# Patient Record
Sex: Male | Born: 1960
Health system: Southern US, Community
[De-identification: ages and names within clinical notes are randomized; demographics above are authoritative.]

## PROBLEM LIST (undated history)

## (undated) DIAGNOSIS — I10 Essential (primary) hypertension: Secondary | ICD-10-CM

## (undated) DIAGNOSIS — T7840XA Allergy, unspecified, initial encounter: Secondary | ICD-10-CM

## (undated) DIAGNOSIS — F32A Depression, unspecified: Secondary | ICD-10-CM

## (undated) DIAGNOSIS — F329 Major depressive disorder, single episode, unspecified: Secondary | ICD-10-CM

## (undated) HISTORY — DX: Depression, unspecified: F32.A

## (undated) HISTORY — DX: Major depressive disorder, single episode, unspecified: F32.9

## (undated) HISTORY — DX: Allergy, unspecified, initial encounter: T78.40XA

## (undated) HISTORY — DX: Essential (primary) hypertension: I10

## (undated) SURGERY — Surgical Case
Anesthesia: *Unknown

---

## 1999-04-10 ENCOUNTER — Emergency Department (HOSPITAL_COMMUNITY): Admission: EM | Admit: 1999-04-10 | Discharge: 1999-04-11 | Payer: Self-pay | Admitting: Emergency Medicine

## 2000-05-24 ENCOUNTER — Ambulatory Visit (HOSPITAL_COMMUNITY): Admission: RE | Admit: 2000-05-24 | Discharge: 2000-05-24 | Payer: Self-pay | Admitting: Internal Medicine

## 2000-05-24 ENCOUNTER — Encounter: Payer: Self-pay | Admitting: Internal Medicine

## 2001-08-20 ENCOUNTER — Other Ambulatory Visit: Admission: RE | Admit: 2001-08-20 | Discharge: 2001-08-20 | Payer: Self-pay | Admitting: Urology

## 2001-09-03 ENCOUNTER — Ambulatory Visit (HOSPITAL_COMMUNITY): Admission: RE | Admit: 2001-09-03 | Discharge: 2001-09-03 | Payer: Self-pay | Admitting: Internal Medicine

## 2001-09-03 ENCOUNTER — Encounter: Payer: Self-pay | Admitting: Internal Medicine

## 2002-08-14 ENCOUNTER — Ambulatory Visit (HOSPITAL_COMMUNITY): Admission: RE | Admit: 2002-08-14 | Discharge: 2002-08-14 | Payer: Self-pay | Admitting: Internal Medicine

## 2002-08-14 ENCOUNTER — Encounter: Payer: Self-pay | Admitting: Internal Medicine

## 2005-12-10 ENCOUNTER — Emergency Department (HOSPITAL_COMMUNITY): Admission: EM | Admit: 2005-12-10 | Discharge: 2005-12-10 | Payer: Self-pay | Admitting: Emergency Medicine

## 2015-05-18 ENCOUNTER — Ambulatory Visit (INDEPENDENT_AMBULATORY_CARE_PROVIDER_SITE_OTHER): Payer: Worker's Compensation | Admitting: Urgent Care

## 2015-05-18 ENCOUNTER — Ambulatory Visit: Payer: Worker's Compensation

## 2015-05-18 VITALS — BP 110/70 | HR 92 | Temp 98.1°F | Resp 16 | Ht 68.5 in | Wt 248.0 lb

## 2015-05-18 DIAGNOSIS — M25571 Pain in right ankle and joints of right foot: Secondary | ICD-10-CM | POA: Diagnosis not present

## 2015-05-18 DIAGNOSIS — S9031XA Contusion of right foot, initial encounter: Secondary | ICD-10-CM | POA: Diagnosis not present

## 2015-05-18 DIAGNOSIS — M79671 Pain in right foot: Secondary | ICD-10-CM

## 2015-05-18 DIAGNOSIS — W19XXXA Unspecified fall, initial encounter: Secondary | ICD-10-CM

## 2015-05-18 DIAGNOSIS — S9030XA Contusion of unspecified foot, initial encounter: Secondary | ICD-10-CM | POA: Insufficient documentation

## 2015-05-18 DIAGNOSIS — S9001XA Contusion of right ankle, initial encounter: Secondary | ICD-10-CM

## 2015-05-18 DIAGNOSIS — S8253XA Displaced fracture of medial malleolus of unspecified tibia, initial encounter for closed fracture: Secondary | ICD-10-CM | POA: Insufficient documentation

## 2015-05-18 MED ORDER — MELOXICAM 15 MG PO TABS: 15.0000 mg | ORAL_TABLET | Freq: Every day | ORAL | Status: DC

## 2015-05-18 MED ORDER — OXYCODONE-ACETAMINOPHEN 5-325 MG PO TABS: 1.0000 | ORAL_TABLET | Freq: Three times a day (TID) | ORAL | Status: AC | PRN

## 2015-05-18 NOTE — Progress Notes (Addendum)
MRN: [id number] DOB: [birth year]  Subjective:   Malik Kennedy is a 54 y.o. male presenting for chief complaint of Ankle Injury and Foot Injury  Reports fall from his truck earlier today while at work. Patient landed from about 5 feet high on both feet and states that he felt pain immediately that has progressively worsened over his right heel and ankle. Reports that he has had increasing difficulty walking and bearing weight on his right foot. Also feels like his foot is numb, this is new for him. Admits that he has decreased range of motion but is mostly due to the sharp pain. Denies swelling, redness, bony deformity, tingling; denies rolling his ankle. Denies smoking cigarettes or alcohol use. Denies any other aggravating or relieving factors, no other questions or concerns.  Medications and past medical history not entered due to worker's comp case.  ROS As in subjective.  Objective:   Vitals: BP 110/70 mmHg  Pulse 92  Temp(Src) 98.1 F (36.7 C) (Oral)  Resp 16  Ht 5' 8.5" (1.74 m)  Wt 248 lb (112.492 kg)  BMI 37.16 kg/m2  SpO2 98%  Physical Exam  Constitutional: He is oriented to person, place, and time. He appears well-developed and well-nourished.  Cardiovascular: Normal rate.   Pulmonary/Chest: Effort normal.  Musculoskeletal:       Right ankle: He exhibits decreased range of motion (patient reports that he does not want to move it due to pain). He exhibits no swelling, no ecchymosis, no deformity, no laceration and normal pulse. Tenderness. Lateral malleolus, AITFL, posterior TFL and head of 5th metatarsal tenderness found. No medial malleolus and no proximal fibula tenderness found. Achilles tendon exhibits pain. Achilles tendon exhibits no defect and normal Thompson's test results.  Neurological: He is alert and oriented to person, place, and time.  Skin: Skin is warm and dry. No rash noted. No erythema. No pallor.   [facility] reading (PRIMARY) by  Dr. Patsy Lager and  PA-Zaria Taha. Right ankle and foot - possible avulsion fracture of medial malleolus, old fractures of 5th distal and proximal phalanges.  Dg Ankle Complete Right  05/18/2015   CLINICAL DATA:  54 year old male with right ankle and foot pain concern for possible avulsion fracture of the medial malleolus  EXAM: RIGHT ANKLE - COMPLETE 3+ VIEW  COMPARISON:  Foot radiograph dated 05/18/2015  FINDINGS: There is no fracture or dislocation. The ankle mortise is intact. Soft tissues are unremarkable.  IMPRESSION: No fracture or dislocation.   Electronically Signed   By: Elgie Collard M.D.   On: 05/18/2015 17:07   Dg Foot Complete Right  05/18/2015   CLINICAL DATA:  Acute right foot pain after falling out of truck. Initial encounter.  EXAM: RIGHT FOOT COMPLETE - 3+ VIEW  COMPARISON:  None.  FINDINGS: Moderately displaced fracture seen involving the proximal base of the the fifth proximal phalanx. This appears to be closed and posttraumatic. Joint spaces appear to be intact. No soft tissue abnormality is noted.  IMPRESSION: Moderately displaced fracture seen involving the fifth proximal phalanx.   Electronically Signed   By: Lupita Raider, M.D.   On: 05/18/2015 17:09   Assessment and Plan :   1. Fall, initial encounter 2. Right foot pain 3. Right ankle pain 4. Contusion of heel, right, initial encounter 5. Avulsion fracture of medial malleolus, right, closed, initial encounter - Stable, wear cam-walker boot and use crutches for walking, work restrictions provided, follow up in 3 days.  Wallis Bamberg, PA-C [facility] [phone] 05/18/2015 3:40 PM    UPDATE: Radiologist over-read was negative for fracture. Continue with treatment plan as discussed in clinic.

## 2015-05-21 ENCOUNTER — Ambulatory Visit (INDEPENDENT_AMBULATORY_CARE_PROVIDER_SITE_OTHER): Payer: Worker's Compensation | Admitting: Internal Medicine

## 2015-05-21 VITALS — BP 122/74 | HR 70 | Temp 98.0°F | Resp 17 | Ht 68.5 in | Wt 254.0 lb

## 2015-05-21 DIAGNOSIS — S9031XD Contusion of right foot, subsequent encounter: Secondary | ICD-10-CM | POA: Diagnosis not present

## 2015-05-21 DIAGNOSIS — M79671 Pain in right foot: Secondary | ICD-10-CM | POA: Diagnosis not present

## 2015-05-21 NOTE — Progress Notes (Signed)
Received over-read reports:  RIGHT ANKLE - COMPLETE 3+ VIEW  COMPARISON: Foot radiograph dated 05/18/2015  FINDINGS: There is no fracture or dislocation. The ankle mortise is intact. Soft tissues are unremarkable.  IMPRESSION: No fracture or dislocation.  RIGHT FOOT COMPLETE - 3+ VIEW  COMPARISON: None.  FINDINGS: Moderately displaced fracture seen involving the proximal base of the the fifth proximal phalanx. This appears to be closed and posttraumatic. Joint spaces appear to be intact. No soft tissue abnormality is noted.  IMPRESSION: Moderately displaced fracture seen involving the fifth proximal Phalanx.  Pt endorses history of old toe fracture. Otherwise no acute fracture noted, he does not have pain in the toes so suspect this is an old fracture.  Will send him a copy

## 2015-05-21 NOTE — Progress Notes (Signed)
   Subjective:    Patient ID: Malik Kennedy, male    DOB: Feb 08, 1961, 54 y.o.   MRN: [id number] This chart was scribed for Ellamae Siaobert Onaje Warne, MD by Littie Deedsichard Sun, Medical Scribe. This patient was seen in Room 4 and the patient's care was started at 9:11 AM.   HPI HPI Comments: Malik Kennedy is a 54 y.o. male who presents to the [facility] for a follow-up for a right foot and ankle injury at work that occurred 3 days ago after falling from a truck. Patient was seen here by Wallis BambergMario Mani, PA-C the day of the injury. He notes that he still has pain in his ankle and that it is still difficult to bear weight, even with the boot on. His work does not have light duty.   Review of Systems No change from 3 days ago    Objective:   Physical Exam  Constitutional: He is oriented to person, place, and time. He appears well-developed and well-nourished. No distress.  HENT:  Head: Normocephalic and atraumatic.  Eyes: Pupils are equal, round, and reactive to light.  Neck: Neck supple.  Musculoskeletal:  Right ankle is minimally swollen but has tenderness over the anterior talofibular ligament area with resolving ecchymoses. Ankle mortise is stable to exam. Achilles stable. No pain over the fifth metatarsal. Good flexion and extension of the toes. Very tender heel squeeze and much pain with pressure up under the heel. Stretching of the plantar Fascia increases pain at the heel  Neurological: He is alert and oriented to person, place, and time.  Psychiatric: He has a normal mood and affect. His behavior is normal.  Vitals reviewed.   Radiologist suggested no fracture with original x-ray      Assessment & Plan:  Right foot pain  Contusion of heel, right, subsequent encounter  He will continue advancing weight bearing as tolerated with crutches and Cam Walker He is cleared only to work sitting which is not available at his job site Follow-up exam 2 weeks Continue ice twice a day  I  have completed the patient encounter in its entirety as documented by the scribe, with editing by me where necessary. Keatyn Jawad P. Merla Richesoolittle, M.D.

## 2015-06-04 ENCOUNTER — Ambulatory Visit (INDEPENDENT_AMBULATORY_CARE_PROVIDER_SITE_OTHER): Payer: Worker's Compensation | Admitting: Emergency Medicine

## 2015-06-04 VITALS — BP 120/76 | HR 77 | Temp 98.5°F | Resp 16 | Ht 69.0 in | Wt 248.6 lb

## 2015-06-04 DIAGNOSIS — M25571 Pain in right ankle and joints of right foot: Secondary | ICD-10-CM | POA: Diagnosis not present

## 2015-06-04 MED ORDER — NAPROXEN SODIUM 550 MG PO TABS: 550.0000 mg | ORAL_TABLET | Freq: Two times a day (BID) | ORAL | Status: AC

## 2015-06-04 NOTE — Patient Instructions (Signed)

## 2015-06-04 NOTE — Progress Notes (Signed)
Subjective:  Patient ID: Malik Kennedy, male    DOB: 09/08/1961  Age: 54 y.o. MRN: [id number]  CC: Follow-up   HPI Malik Kennedy presents  for follow-up Workmen's Comp. injury from 3 weeks ago. He jumped out of truck and developed pain in foot and ankle. He was radiographed in the and his x-rays were negative. He was using a boot was encouraged to weeks ago to ambulate on the boot decreased use of his crutches. He is coming here stating that he's stop using a boot and crutches. He still has pain with ambulation. He is not return to work.  History Malik Kennedy has a past medical history of Depression; Hypertension; and Allergy.   He has no past surgical history on file.   His  family history includes Diabetes in his father; Hyperlipidemia in his father; Hypertension in his father and mother; Stroke in his father.  He   reports that he has never smoked. He has never used smokeless tobacco. He reports that he does not drink alcohol or use illicit drugs.  Outpatient Prescriptions Prior to Visit  Medication Sig Dispense Refill  . BUMETANIDE PO Take by mouth.    Marland Kitchen CARVEDILOL PO Take by mouth.    . Dapagliflozin Propanediol (FARXIGA PO) Take by mouth.    . ENALAPRIL MALEATE PO Take by mouth.    . GLYBURIDE PO Take by mouth 2 (two) times daily.    Marland Kitchen METFORMIN HCL PO Take by mouth.    . oxyCODONE-acetaminophen (ROXICET) 5-325 MG per tablet Take 1 tablet by mouth every 8 (eight) hours as needed for severe pain. 20 tablet 0  . SIMVASTATIN PO Take by mouth.    . meloxicam (MOBIC) 15 MG tablet Take 1 tablet (15 mg total) by mouth daily. 30 tablet 0   No facility-administered medications prior to visit.    History   Social History  . Marital Status: Married    Spouse Name: N/A  . Number of Children: N/A  . Years of Education: N/A   Social History Main Topics  . Smoking status: Never Smoker   . Smokeless tobacco: Never Used  . Alcohol Use: No  . Drug Use: No  . Sexual Activity: Not on  file   Other Topics Concern  . None   Social History Narrative     Review of Systems  Constitutional: Negative for fever, chills and appetite change.  HENT: Negative for congestion, ear pain, postnasal drip, sinus pressure and sore throat.   Eyes: Negative for pain and redness.  Respiratory: Negative for cough, shortness of breath and wheezing.   Cardiovascular: Negative for leg swelling.  Gastrointestinal: Negative for nausea, vomiting, abdominal pain, diarrhea, constipation and blood in stool.  Endocrine: Negative for polyuria.  Genitourinary: Negative for dysuria, urgency, frequency and flank pain.  Musculoskeletal: Negative for gait problem.  Skin: Negative for rash.  Neurological: Negative for weakness and headaches.  Psychiatric/Behavioral: Negative for confusion and decreased concentration. The patient is not nervous/anxious.     Objective:  BP 120/76 mmHg  Pulse 77  Temp(Src) 98.5 F (36.9 C) (Oral)  Resp 16  Ht 5\' 9"  (1.753 m)  Wt 248 lb 9.6 oz (112.764 kg)  BMI 36.69 kg/m2  SpO2 98%  Physical Exam  Constitutional: He is oriented to person, place, and time. He appears well-developed and well-nourished.  HENT:  Head: Normocephalic and atraumatic.  Eyes: Conjunctivae are normal. Pupils are equal, round, and reactive to light.  Pulmonary/Chest: Effort normal.  Musculoskeletal: He exhibits  no edema.  Neurological: He is alert and oriented to person, place, and time. Gait abnormal.  Skin: Skin is dry.  Psychiatric: He has a normal mood and affect. His behavior is normal. Thought content normal.   he has an antalgic gait. His musculoskeletal exam essentially normal.    Assessment & Plan:   Pearse was seen today for follow-up.  Diagnoses and all orders for this visit:  Right ankle pain Orders: -     Ambulatory referral to Orthopedic Surgery  Other orders -     naproxen sodium (ANAPROX DS) 550 MG tablet; Take 1 tablet (550 mg total) by mouth 2 (two) times  daily with a meal.   I have discontinued Malik Kennedy's meloxicam. I am also having him start on naproxen sodium. Additionally, I am having him maintain his GLYBURIDE PO, METFORMIN HCL PO, SIMVASTATIN PO, CARVEDILOL PO, ENALAPRIL MALEATE PO, BUMETANIDE PO, Dapagliflozin Propanediol (FARXIGA PO), and oxyCODONE-acetaminophen.  Meds ordered this encounter  Medications  . naproxen sodium (ANAPROX DS) 550 MG tablet    Sig: Take 1 tablet (550 mg total) by mouth 2 (two) times daily with a meal.    Dispense:  40 tablet    Refill:  0   He was continued on light duty which he cannot do at work Appropriate red flag conditions were discussed with the patient as well as actions that should be taken.  Patient expressed his understanding.  Follow-up: Return in about 1 week (around 06/11/2015).  Carmelina Dane, MD

## 2015-06-07 ENCOUNTER — Telehealth: Payer: Self-pay

## 2015-06-07 NOTE — Telephone Encounter (Signed)
Copy sent to xray. 

## 2015-06-07 NOTE — Telephone Encounter (Signed)
This patient needs an xray disc of RIGHT foot and ankle done on 05/18/15 to take to an orthopedic appt tomorrow Pt wants to pick up today at 5:00

## 2016-08-11 IMAGING — CR DG ANKLE COMPLETE 3+V*R*
3 series · 3 of 3 positions shown · non-contrast
Comparison: Foot radiograph dated 05/18/2015

CLINICAL DATA: 54-year-old male with right ankle and foot pain
concern for possible avulsion fracture of the medial malleolus

EXAM:
RIGHT ANKLE - COMPLETE 3+ VIEW

[AP]
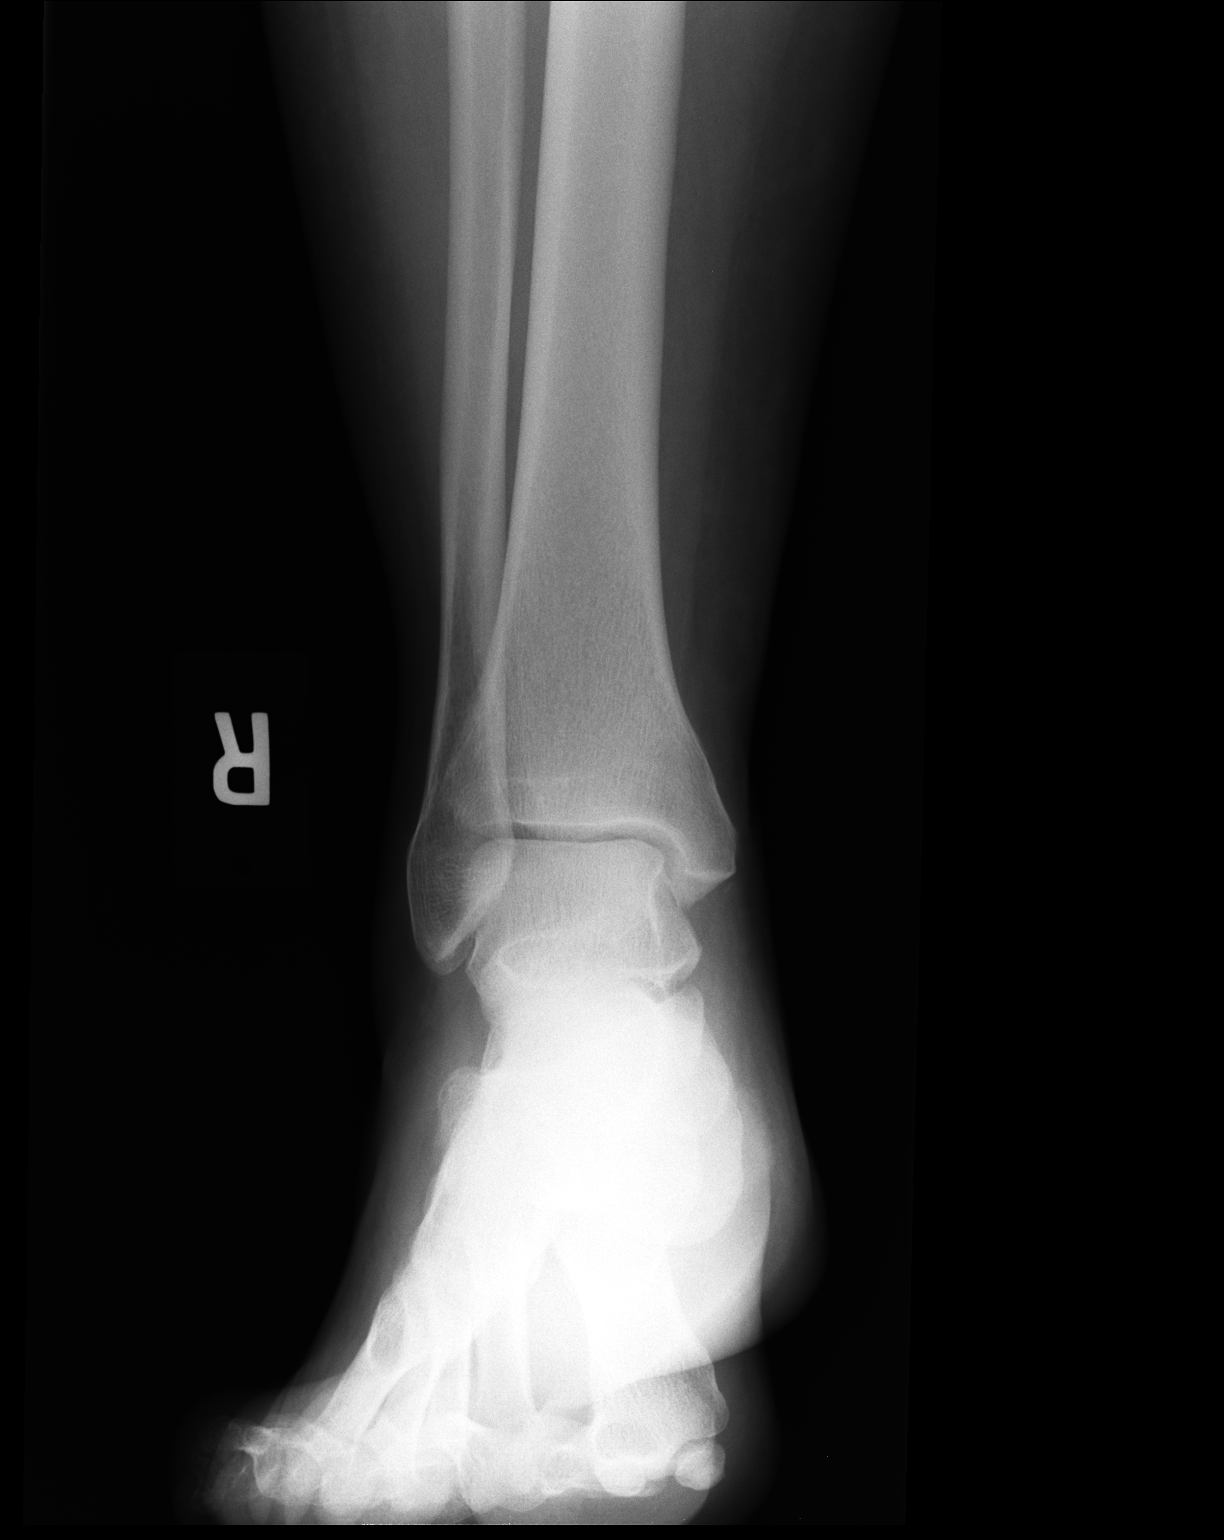

[lateral]
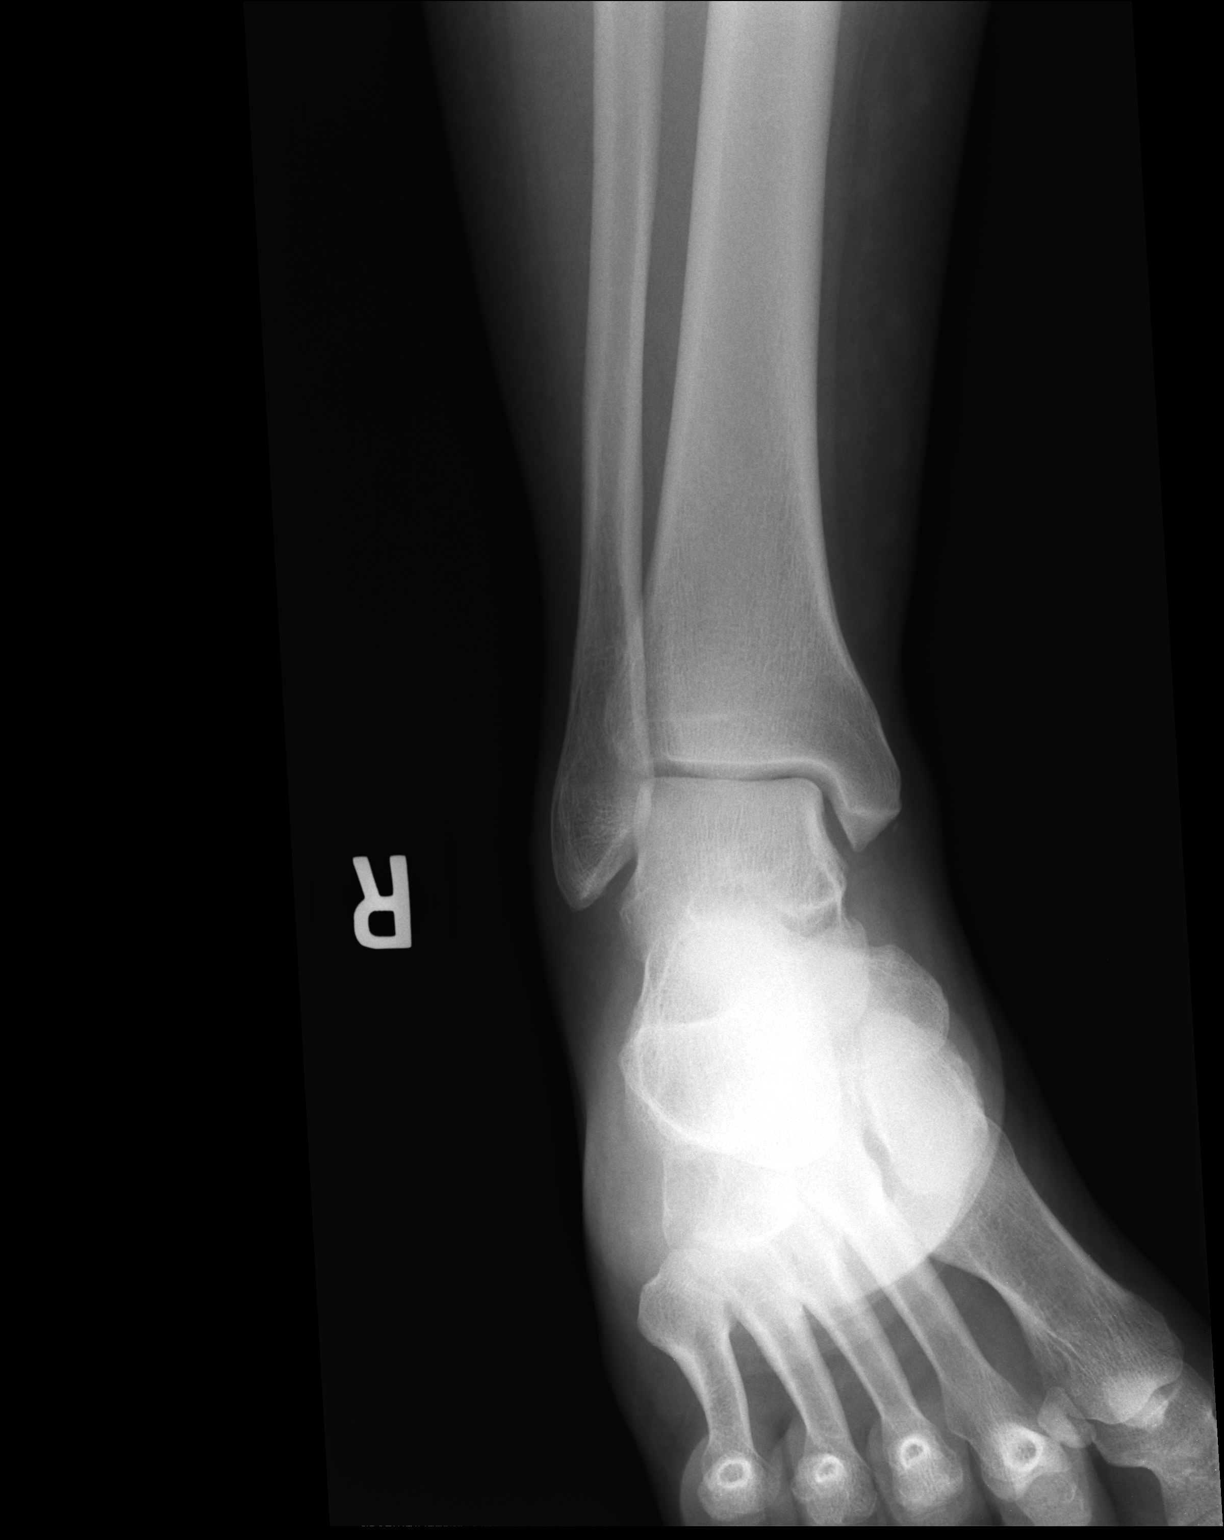

[medial obl]
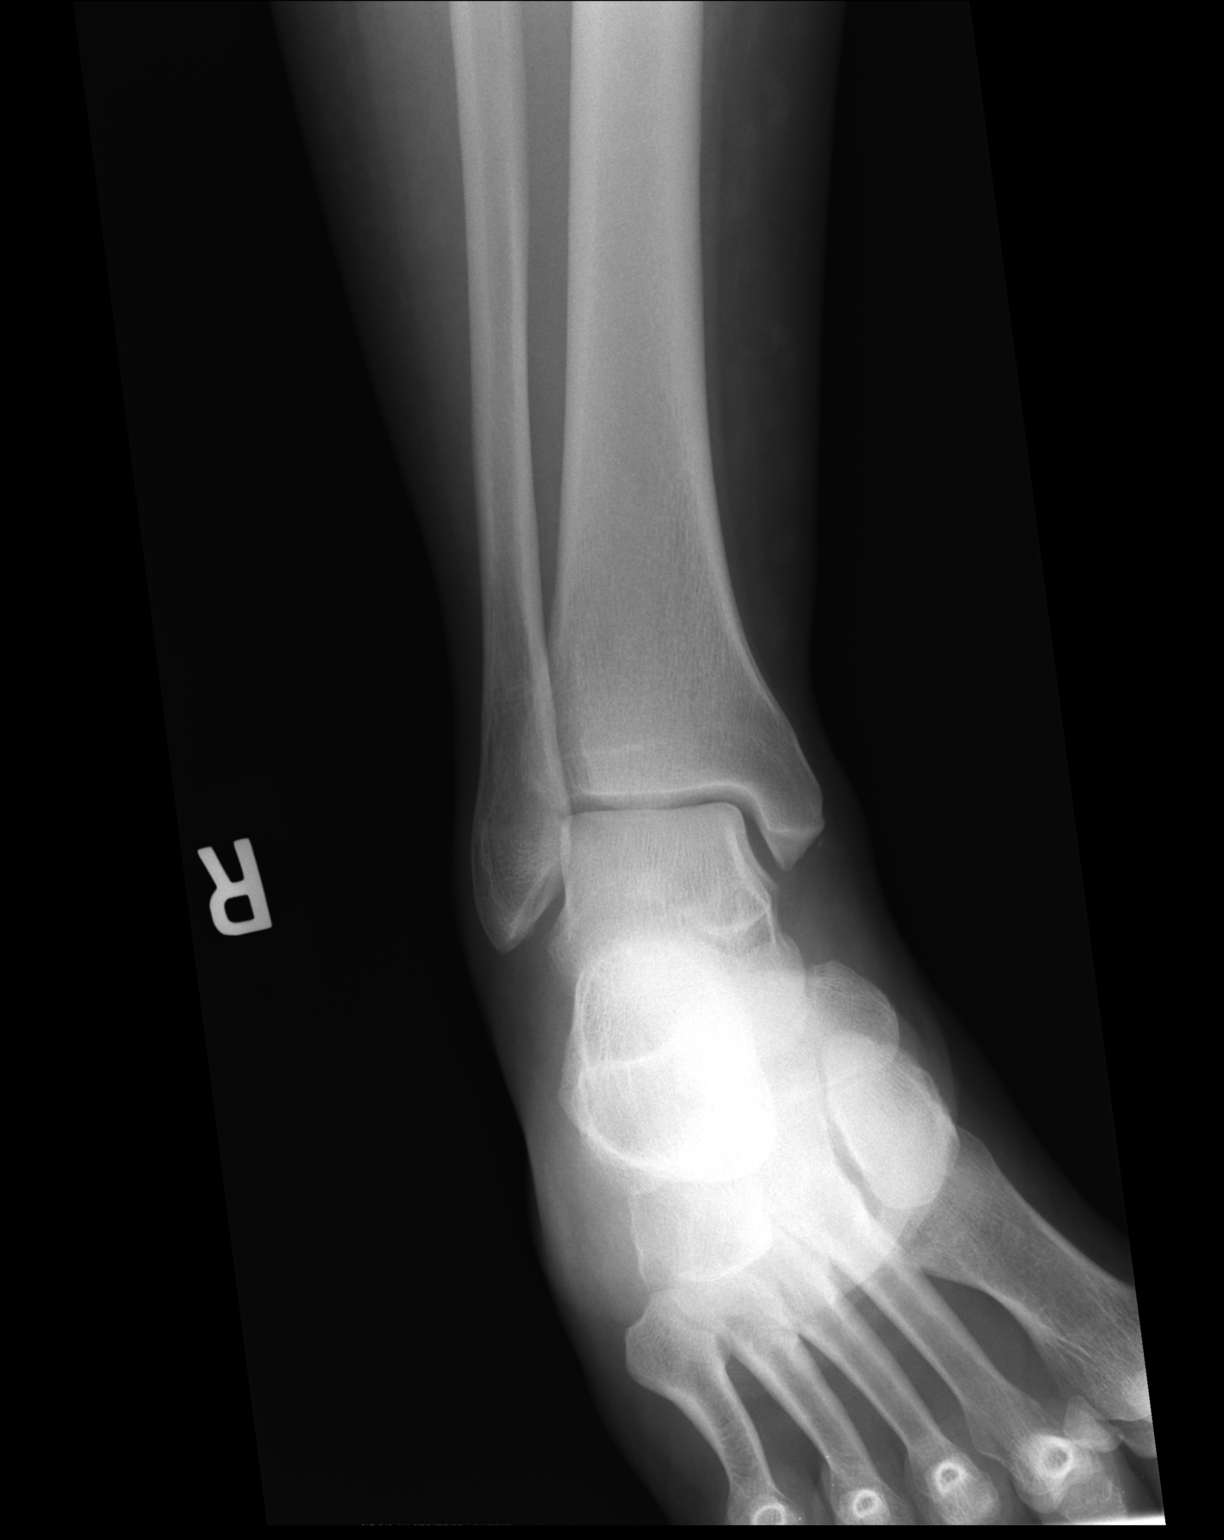

[3 of 3 positions shown; findings below may reference images not displayed]

FINDINGS: There is no fracture or dislocation. The ankle mortise is intact.
Soft tissues are unremarkable.
IMPRESSION: No fracture or dislocation.

## 2016-11-08 DIAGNOSIS — E1165 Type 2 diabetes mellitus with hyperglycemia: Secondary | ICD-10-CM | POA: Diagnosis not present

## 2016-11-08 DIAGNOSIS — I10 Essential (primary) hypertension: Secondary | ICD-10-CM | POA: Diagnosis not present

## 2016-11-08 DIAGNOSIS — Z1389 Encounter for screening for other disorder: Secondary | ICD-10-CM | POA: Diagnosis not present

## 2016-11-08 DIAGNOSIS — Z79899 Other long term (current) drug therapy: Secondary | ICD-10-CM | POA: Diagnosis not present

## 2016-11-08 DIAGNOSIS — E7800 Pure hypercholesterolemia, unspecified: Secondary | ICD-10-CM | POA: Diagnosis not present

## 2016-11-08 DIAGNOSIS — E559 Vitamin D deficiency, unspecified: Secondary | ICD-10-CM | POA: Diagnosis not present

## 2016-12-11 DIAGNOSIS — E7800 Pure hypercholesterolemia, unspecified: Secondary | ICD-10-CM | POA: Diagnosis not present

## 2016-12-11 DIAGNOSIS — E1165 Type 2 diabetes mellitus with hyperglycemia: Secondary | ICD-10-CM | POA: Diagnosis not present

## 2016-12-11 DIAGNOSIS — Z79899 Other long term (current) drug therapy: Secondary | ICD-10-CM | POA: Diagnosis not present

## 2016-12-11 DIAGNOSIS — I10 Essential (primary) hypertension: Secondary | ICD-10-CM | POA: Diagnosis not present

## 2017-02-16 DIAGNOSIS — Z79899 Other long term (current) drug therapy: Secondary | ICD-10-CM | POA: Diagnosis not present

## 2017-02-16 DIAGNOSIS — E7800 Pure hypercholesterolemia, unspecified: Secondary | ICD-10-CM | POA: Diagnosis not present

## 2017-02-16 DIAGNOSIS — E559 Vitamin D deficiency, unspecified: Secondary | ICD-10-CM | POA: Diagnosis not present

## 2017-05-29 DIAGNOSIS — J050 Acute obstructive laryngitis [croup]: Secondary | ICD-10-CM | POA: Diagnosis not present

## 2017-05-29 DIAGNOSIS — E1165 Type 2 diabetes mellitus with hyperglycemia: Secondary | ICD-10-CM | POA: Diagnosis not present

## 2017-05-29 DIAGNOSIS — J0100 Acute maxillary sinusitis, unspecified: Secondary | ICD-10-CM | POA: Diagnosis not present

## 2017-06-18 DIAGNOSIS — E559 Vitamin D deficiency, unspecified: Secondary | ICD-10-CM | POA: Diagnosis not present

## 2017-06-18 DIAGNOSIS — Z79899 Other long term (current) drug therapy: Secondary | ICD-10-CM | POA: Diagnosis not present

## 2017-06-18 DIAGNOSIS — E7800 Pure hypercholesterolemia, unspecified: Secondary | ICD-10-CM | POA: Diagnosis not present

## 2017-06-18 DIAGNOSIS — E1165 Type 2 diabetes mellitus with hyperglycemia: Secondary | ICD-10-CM | POA: Diagnosis not present

## 2017-06-18 DIAGNOSIS — I10 Essential (primary) hypertension: Secondary | ICD-10-CM | POA: Diagnosis not present

## 2017-09-24 DIAGNOSIS — Z79899 Other long term (current) drug therapy: Secondary | ICD-10-CM | POA: Diagnosis not present

## 2017-09-24 DIAGNOSIS — E1165 Type 2 diabetes mellitus with hyperglycemia: Secondary | ICD-10-CM | POA: Diagnosis not present

## 2017-09-24 DIAGNOSIS — I10 Essential (primary) hypertension: Secondary | ICD-10-CM | POA: Diagnosis not present

## 2017-09-24 DIAGNOSIS — E7800 Pure hypercholesterolemia, unspecified: Secondary | ICD-10-CM | POA: Diagnosis not present

## 2017-09-24 DIAGNOSIS — E559 Vitamin D deficiency, unspecified: Secondary | ICD-10-CM | POA: Diagnosis not present

## 2017-12-26 DIAGNOSIS — I10 Essential (primary) hypertension: Secondary | ICD-10-CM | POA: Diagnosis not present

## 2017-12-26 DIAGNOSIS — E7800 Pure hypercholesterolemia, unspecified: Secondary | ICD-10-CM | POA: Diagnosis not present

## 2017-12-26 DIAGNOSIS — E1165 Type 2 diabetes mellitus with hyperglycemia: Secondary | ICD-10-CM | POA: Diagnosis not present

## 2017-12-26 DIAGNOSIS — Z1331 Encounter for screening for depression: Secondary | ICD-10-CM | POA: Diagnosis not present

## 2017-12-26 DIAGNOSIS — Z79899 Other long term (current) drug therapy: Secondary | ICD-10-CM | POA: Diagnosis not present

## 2017-12-26 DIAGNOSIS — E559 Vitamin D deficiency, unspecified: Secondary | ICD-10-CM | POA: Diagnosis not present

## 2018-03-25 DIAGNOSIS — E559 Vitamin D deficiency, unspecified: Secondary | ICD-10-CM | POA: Diagnosis not present

## 2018-03-25 DIAGNOSIS — E7800 Pure hypercholesterolemia, unspecified: Secondary | ICD-10-CM | POA: Diagnosis not present

## 2018-03-25 DIAGNOSIS — I10 Essential (primary) hypertension: Secondary | ICD-10-CM | POA: Diagnosis not present

## 2018-03-25 DIAGNOSIS — E1165 Type 2 diabetes mellitus with hyperglycemia: Secondary | ICD-10-CM | POA: Diagnosis not present

## 2018-03-25 DIAGNOSIS — Z79899 Other long term (current) drug therapy: Secondary | ICD-10-CM | POA: Diagnosis not present

## 2018-07-02 DIAGNOSIS — Z79899 Other long term (current) drug therapy: Secondary | ICD-10-CM | POA: Diagnosis not present

## 2018-07-02 DIAGNOSIS — I10 Essential (primary) hypertension: Secondary | ICD-10-CM | POA: Diagnosis not present

## 2018-07-02 DIAGNOSIS — E1165 Type 2 diabetes mellitus with hyperglycemia: Secondary | ICD-10-CM | POA: Diagnosis not present

## 2018-07-02 DIAGNOSIS — E559 Vitamin D deficiency, unspecified: Secondary | ICD-10-CM | POA: Diagnosis not present

## 2018-07-02 DIAGNOSIS — E7800 Pure hypercholesterolemia, unspecified: Secondary | ICD-10-CM | POA: Diagnosis not present

## 2018-10-08 DIAGNOSIS — E7800 Pure hypercholesterolemia, unspecified: Secondary | ICD-10-CM | POA: Diagnosis not present

## 2018-10-08 DIAGNOSIS — I10 Essential (primary) hypertension: Secondary | ICD-10-CM | POA: Diagnosis not present

## 2018-10-08 DIAGNOSIS — Z79899 Other long term (current) drug therapy: Secondary | ICD-10-CM | POA: Diagnosis not present

## 2018-10-08 DIAGNOSIS — E559 Vitamin D deficiency, unspecified: Secondary | ICD-10-CM | POA: Diagnosis not present

## 2018-10-08 DIAGNOSIS — E1165 Type 2 diabetes mellitus with hyperglycemia: Secondary | ICD-10-CM | POA: Diagnosis not present

## 2018-11-26 ENCOUNTER — Other Ambulatory Visit (HOSPITAL_COMMUNITY): Payer: Self-pay | Admitting: Preventative Medicine

## 2018-11-26 DIAGNOSIS — S2242XA Multiple fractures of ribs, left side, initial encounter for closed fracture: Secondary | ICD-10-CM

## 2018-12-04 ENCOUNTER — Other Ambulatory Visit (HOSPITAL_COMMUNITY): Payer: Self-pay

## 2018-12-04 ENCOUNTER — Encounter (HOSPITAL_COMMUNITY): Payer: Self-pay

## 2018-12-09 ENCOUNTER — Encounter (HOSPITAL_COMMUNITY)
Admission: RE | Admit: 2018-12-09 | Discharge: 2018-12-09 | Disposition: A | Discharge status: Home / Self Care | Payer: Self-pay | Source: Ambulatory Visit | Attending: Preventative Medicine | Admitting: Preventative Medicine

## 2018-12-09 DIAGNOSIS — S2242XA Multiple fractures of ribs, left side, initial encounter for closed fracture: Secondary | ICD-10-CM | POA: Insufficient documentation

## 2018-12-09 MED ORDER — TECHNETIUM TC 99M MEDRONATE IV KIT
20.0000 | PACK | Freq: Once | INTRAVENOUS | Status: AC | PRN
  Administered 2018-12-09: 20.4 via INTRAVENOUS

## 2019-01-14 DIAGNOSIS — Z79899 Other long term (current) drug therapy: Secondary | ICD-10-CM | POA: Diagnosis not present

## 2019-01-14 DIAGNOSIS — I10 Essential (primary) hypertension: Secondary | ICD-10-CM | POA: Diagnosis not present

## 2019-01-14 DIAGNOSIS — E559 Vitamin D deficiency, unspecified: Secondary | ICD-10-CM | POA: Diagnosis not present

## 2019-01-14 DIAGNOSIS — E118 Type 2 diabetes mellitus with unspecified complications: Secondary | ICD-10-CM | POA: Diagnosis not present

## 2019-01-14 DIAGNOSIS — E7800 Pure hypercholesterolemia, unspecified: Secondary | ICD-10-CM | POA: Diagnosis not present

## 2019-03-14 DIAGNOSIS — J209 Acute bronchitis, unspecified: Secondary | ICD-10-CM | POA: Diagnosis not present

## 2019-03-14 DIAGNOSIS — R509 Fever, unspecified: Secondary | ICD-10-CM | POA: Diagnosis not present

## 2019-03-14 DIAGNOSIS — E119 Type 2 diabetes mellitus without complications: Secondary | ICD-10-CM | POA: Diagnosis not present

## 2019-04-22 DIAGNOSIS — Z1331 Encounter for screening for depression: Secondary | ICD-10-CM | POA: Diagnosis not present

## 2019-04-22 DIAGNOSIS — Z79899 Other long term (current) drug therapy: Secondary | ICD-10-CM | POA: Diagnosis not present

## 2019-04-22 DIAGNOSIS — E7800 Pure hypercholesterolemia, unspecified: Secondary | ICD-10-CM | POA: Diagnosis not present

## 2019-04-22 DIAGNOSIS — E559 Vitamin D deficiency, unspecified: Secondary | ICD-10-CM | POA: Diagnosis not present

## 2019-04-22 DIAGNOSIS — E118 Type 2 diabetes mellitus with unspecified complications: Secondary | ICD-10-CM | POA: Diagnosis not present

## 2019-04-22 DIAGNOSIS — I10 Essential (primary) hypertension: Secondary | ICD-10-CM | POA: Diagnosis not present

## 2019-08-05 DIAGNOSIS — Z79899 Other long term (current) drug therapy: Secondary | ICD-10-CM | POA: Diagnosis not present

## 2019-08-05 DIAGNOSIS — E7800 Pure hypercholesterolemia, unspecified: Secondary | ICD-10-CM | POA: Diagnosis not present

## 2019-08-05 DIAGNOSIS — E559 Vitamin D deficiency, unspecified: Secondary | ICD-10-CM | POA: Diagnosis not present

## 2019-08-05 DIAGNOSIS — E118 Type 2 diabetes mellitus with unspecified complications: Secondary | ICD-10-CM | POA: Diagnosis not present

## 2019-08-05 DIAGNOSIS — I10 Essential (primary) hypertension: Secondary | ICD-10-CM | POA: Diagnosis not present

## 2019-11-19 DIAGNOSIS — I10 Essential (primary) hypertension: Secondary | ICD-10-CM | POA: Diagnosis not present

## 2019-11-19 DIAGNOSIS — E559 Vitamin D deficiency, unspecified: Secondary | ICD-10-CM | POA: Diagnosis not present

## 2019-11-19 DIAGNOSIS — Z79899 Other long term (current) drug therapy: Secondary | ICD-10-CM | POA: Diagnosis not present

## 2019-11-19 DIAGNOSIS — E118 Type 2 diabetes mellitus with unspecified complications: Secondary | ICD-10-CM | POA: Diagnosis not present

## 2019-11-19 DIAGNOSIS — E7800 Pure hypercholesterolemia, unspecified: Secondary | ICD-10-CM | POA: Diagnosis not present

## 2020-02-18 DIAGNOSIS — I10 Essential (primary) hypertension: Secondary | ICD-10-CM | POA: Diagnosis not present

## 2020-02-18 DIAGNOSIS — E7800 Pure hypercholesterolemia, unspecified: Secondary | ICD-10-CM | POA: Diagnosis not present

## 2020-02-18 DIAGNOSIS — E118 Type 2 diabetes mellitus with unspecified complications: Secondary | ICD-10-CM | POA: Diagnosis not present

## 2020-02-18 DIAGNOSIS — Z79899 Other long term (current) drug therapy: Secondary | ICD-10-CM | POA: Diagnosis not present

## 2020-02-18 DIAGNOSIS — E559 Vitamin D deficiency, unspecified: Secondary | ICD-10-CM | POA: Diagnosis not present

## 2020-03-04 IMAGING — NM NM BONE WHOLE BODY
6 series · 6 of 6 positions shown · non-contrast
Comparison: None

Radiographic correlation: Chest and LEFT rib radiographs 11/07/2018

CLINICAL DATA: LEFT rib pain since fall on 11/07/2018

EXAM:
NUCLEAR MEDICINE WHOLE BODY BONE SCAN
TECHNIQUE: Whole body anterior and posterior images were obtained approximately
3 hours after intravenous injection of radiopharmaceutical.
RADIOPHARMACEUTICALS:  20.4 mCi 6echnetium-WWm MDP IV

[Series 1: whole body · 2.66mm/px · 1 of 1 slices shown (1 of 2)]
[im 1/1]
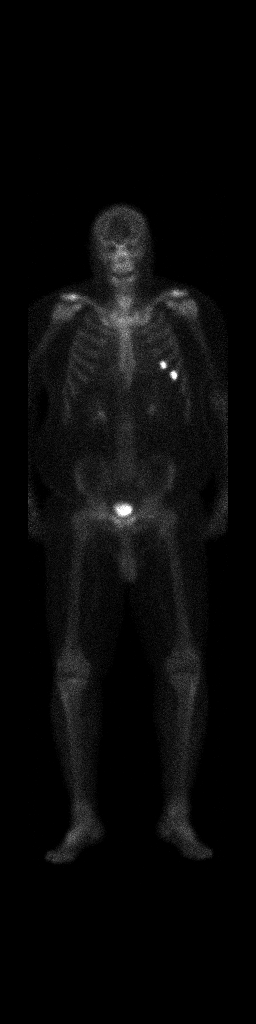

[Series 1: whole body · 2.66mm/px · 1 of 1 slices shown (2 of 2)]
[im 1/1]
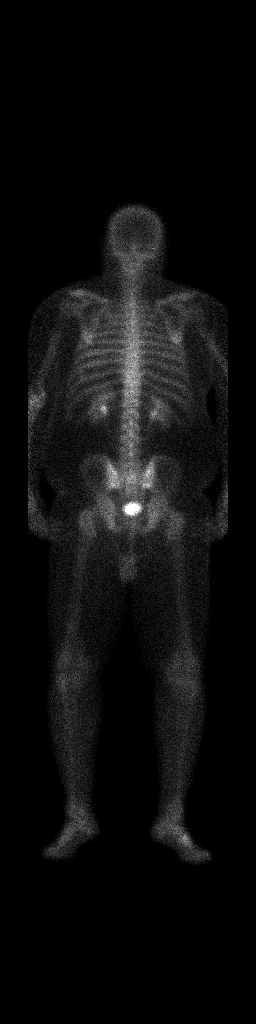

[Series 2: bone statics · 2.07mm/px · 1 of 1 slices shown (1 of 2)]
[im 1/1]
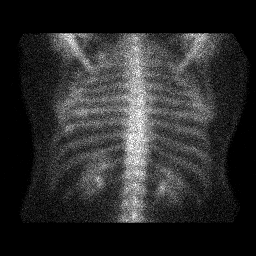

[Series 2: bone statics · 2.07mm/px · 1 of 1 slices shown (2 of 2)]
[im 1/1]
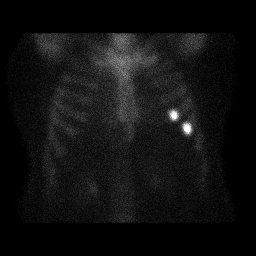

[Series 3: bone statics (2) · 2.07mm/px · 1 of 1 slices shown (1 of 2)]
[im 1/1  full-range]
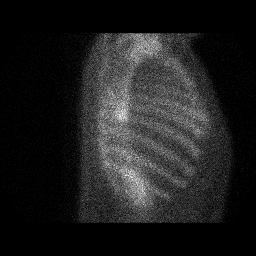

[Series 3: bone statics (2) · 2.07mm/px · 1 of 1 slices shown (2 of 2)]
[im 1/1]
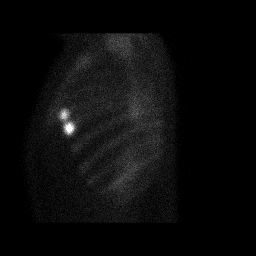

[6 of 6 positions shown; findings below may reference images not displayed]

FINDINGS: Abnormal increased tracer localization is identified at the adjacent
anterior LEFT fifth and sixth ribs.

The adjacent nature of the uptake is most consistent with fractures.

Minimal uptake at this AC joints, typically degenerative.

No additional sites of worrisome osseous tracer accumulation are
identified.

Expected urinary tract and soft tissue distribution of tracer.
IMPRESSION: Uptake at the at adjacent anterior LEFT fifth and sixth ribs most
consistent with fractures.

Remainder of exam unremarkable.

## 2020-04-10 DIAGNOSIS — J0100 Acute maxillary sinusitis, unspecified: Secondary | ICD-10-CM | POA: Diagnosis not present

## 2020-05-19 DIAGNOSIS — E669 Obesity, unspecified: Secondary | ICD-10-CM | POA: Diagnosis not present

## 2020-05-19 DIAGNOSIS — L989 Disorder of the skin and subcutaneous tissue, unspecified: Secondary | ICD-10-CM | POA: Diagnosis not present

## 2020-05-19 DIAGNOSIS — E118 Type 2 diabetes mellitus with unspecified complications: Secondary | ICD-10-CM | POA: Diagnosis not present

## 2020-05-19 DIAGNOSIS — E7800 Pure hypercholesterolemia, unspecified: Secondary | ICD-10-CM | POA: Diagnosis not present

## 2020-05-19 DIAGNOSIS — Z79899 Other long term (current) drug therapy: Secondary | ICD-10-CM | POA: Diagnosis not present

## 2020-05-19 DIAGNOSIS — H6123 Impacted cerumen, bilateral: Secondary | ICD-10-CM | POA: Diagnosis not present

## 2020-05-19 DIAGNOSIS — E559 Vitamin D deficiency, unspecified: Secondary | ICD-10-CM | POA: Diagnosis not present

## 2020-06-08 DIAGNOSIS — L814 Other melanin hyperpigmentation: Secondary | ICD-10-CM | POA: Diagnosis not present

## 2020-06-08 DIAGNOSIS — L578 Other skin changes due to chronic exposure to nonionizing radiation: Secondary | ICD-10-CM | POA: Diagnosis not present

## 2020-06-08 DIAGNOSIS — L570 Actinic keratosis: Secondary | ICD-10-CM | POA: Diagnosis not present

## 2020-08-16 DIAGNOSIS — I4891 Unspecified atrial fibrillation: Secondary | ICD-10-CM | POA: Diagnosis not present

## 2020-08-16 DIAGNOSIS — E669 Obesity, unspecified: Secondary | ICD-10-CM | POA: Diagnosis not present

## 2020-08-16 DIAGNOSIS — E559 Vitamin D deficiency, unspecified: Secondary | ICD-10-CM | POA: Diagnosis not present

## 2020-08-16 DIAGNOSIS — E7800 Pure hypercholesterolemia, unspecified: Secondary | ICD-10-CM | POA: Diagnosis not present

## 2020-08-16 DIAGNOSIS — I10 Essential (primary) hypertension: Secondary | ICD-10-CM | POA: Diagnosis not present

## 2020-08-16 DIAGNOSIS — Z79899 Other long term (current) drug therapy: Secondary | ICD-10-CM | POA: Diagnosis not present

## 2020-08-16 DIAGNOSIS — E118 Type 2 diabetes mellitus with unspecified complications: Secondary | ICD-10-CM | POA: Diagnosis not present

## 2020-08-18 DIAGNOSIS — I4891 Unspecified atrial fibrillation: Secondary | ICD-10-CM | POA: Diagnosis not present

## 2020-08-18 DIAGNOSIS — I10 Essential (primary) hypertension: Secondary | ICD-10-CM | POA: Diagnosis not present

## 2020-08-18 DIAGNOSIS — E7800 Pure hypercholesterolemia, unspecified: Secondary | ICD-10-CM | POA: Diagnosis not present

## 2020-08-20 DIAGNOSIS — I4891 Unspecified atrial fibrillation: Secondary | ICD-10-CM | POA: Diagnosis not present

## 2020-09-02 DIAGNOSIS — I4891 Unspecified atrial fibrillation: Secondary | ICD-10-CM | POA: Diagnosis not present

## 2020-09-02 DIAGNOSIS — I10 Essential (primary) hypertension: Secondary | ICD-10-CM | POA: Diagnosis not present

## 2020-09-08 DIAGNOSIS — E7800 Pure hypercholesterolemia, unspecified: Secondary | ICD-10-CM | POA: Diagnosis not present

## 2020-09-08 DIAGNOSIS — I4891 Unspecified atrial fibrillation: Secondary | ICD-10-CM | POA: Diagnosis not present

## 2020-09-08 DIAGNOSIS — R931 Abnormal findings on diagnostic imaging of heart and coronary circulation: Secondary | ICD-10-CM | POA: Diagnosis not present

## 2020-09-08 DIAGNOSIS — I10 Essential (primary) hypertension: Secondary | ICD-10-CM | POA: Diagnosis not present

## 2020-09-09 DIAGNOSIS — I4891 Unspecified atrial fibrillation: Secondary | ICD-10-CM | POA: Diagnosis not present

## 2020-09-13 DIAGNOSIS — Z20822 Contact with and (suspected) exposure to covid-19: Secondary | ICD-10-CM | POA: Diagnosis not present

## 2020-09-13 DIAGNOSIS — R9431 Abnormal electrocardiogram [ECG] [EKG]: Secondary | ICD-10-CM | POA: Diagnosis not present

## 2020-09-13 DIAGNOSIS — Z01812 Encounter for preprocedural laboratory examination: Secondary | ICD-10-CM | POA: Diagnosis not present

## 2020-09-13 DIAGNOSIS — I4891 Unspecified atrial fibrillation: Secondary | ICD-10-CM | POA: Diagnosis not present

## 2020-09-17 DIAGNOSIS — I5189 Other ill-defined heart diseases: Secondary | ICD-10-CM | POA: Diagnosis not present

## 2020-09-17 DIAGNOSIS — I340 Nonrheumatic mitral (valve) insufficiency: Secondary | ICD-10-CM | POA: Diagnosis not present

## 2020-09-17 DIAGNOSIS — I4819 Other persistent atrial fibrillation: Secondary | ICD-10-CM | POA: Diagnosis not present

## 2020-09-17 DIAGNOSIS — I700 Atherosclerosis of aorta: Secondary | ICD-10-CM | POA: Diagnosis not present

## 2020-10-18 DIAGNOSIS — I340 Nonrheumatic mitral (valve) insufficiency: Secondary | ICD-10-CM | POA: Diagnosis not present

## 2020-10-18 DIAGNOSIS — I511 Rupture of chordae tendineae, not elsewhere classified: Secondary | ICD-10-CM | POA: Diagnosis not present

## 2020-10-18 DIAGNOSIS — I4891 Unspecified atrial fibrillation: Secondary | ICD-10-CM | POA: Diagnosis not present

## 2020-10-18 DIAGNOSIS — Z9889 Other specified postprocedural states: Secondary | ICD-10-CM | POA: Diagnosis not present

## 2020-10-19 DIAGNOSIS — I4891 Unspecified atrial fibrillation: Secondary | ICD-10-CM | POA: Diagnosis not present

## 2020-11-16 DIAGNOSIS — I10 Essential (primary) hypertension: Secondary | ICD-10-CM | POA: Diagnosis not present

## 2020-11-16 DIAGNOSIS — E7800 Pure hypercholesterolemia, unspecified: Secondary | ICD-10-CM | POA: Diagnosis not present

## 2020-11-16 DIAGNOSIS — E118 Type 2 diabetes mellitus with unspecified complications: Secondary | ICD-10-CM | POA: Diagnosis not present

## 2020-11-16 DIAGNOSIS — E559 Vitamin D deficiency, unspecified: Secondary | ICD-10-CM | POA: Diagnosis not present

## 2020-11-16 DIAGNOSIS — Z79899 Other long term (current) drug therapy: Secondary | ICD-10-CM | POA: Diagnosis not present

## 2020-12-01 DIAGNOSIS — I10 Essential (primary) hypertension: Secondary | ICD-10-CM | POA: Diagnosis not present

## 2020-12-01 DIAGNOSIS — I340 Nonrheumatic mitral (valve) insufficiency: Secondary | ICD-10-CM | POA: Diagnosis not present

## 2020-12-01 DIAGNOSIS — I429 Cardiomyopathy, unspecified: Secondary | ICD-10-CM | POA: Diagnosis not present

## 2020-12-01 DIAGNOSIS — E119 Type 2 diabetes mellitus without complications: Secondary | ICD-10-CM | POA: Diagnosis not present

## 2020-12-01 DIAGNOSIS — I4891 Unspecified atrial fibrillation: Secondary | ICD-10-CM | POA: Diagnosis not present

## 2020-12-01 DIAGNOSIS — Z9889 Other specified postprocedural states: Secondary | ICD-10-CM | POA: Diagnosis not present

## 2020-12-01 DIAGNOSIS — I511 Rupture of chordae tendineae, not elsewhere classified: Secondary | ICD-10-CM | POA: Diagnosis not present

## 2020-12-01 DIAGNOSIS — E7800 Pure hypercholesterolemia, unspecified: Secondary | ICD-10-CM | POA: Diagnosis not present

## 2020-12-13 DIAGNOSIS — M79641 Pain in right hand: Secondary | ICD-10-CM | POA: Diagnosis not present

## 2020-12-13 DIAGNOSIS — R0789 Other chest pain: Secondary | ICD-10-CM | POA: Diagnosis not present

## 2020-12-13 DIAGNOSIS — M25531 Pain in right wrist: Secondary | ICD-10-CM | POA: Diagnosis not present

## 2021-01-03 DIAGNOSIS — I517 Cardiomegaly: Secondary | ICD-10-CM | POA: Diagnosis not present

## 2021-01-03 DIAGNOSIS — I4891 Unspecified atrial fibrillation: Secondary | ICD-10-CM | POA: Diagnosis not present

## 2021-01-24 DIAGNOSIS — I429 Cardiomyopathy, unspecified: Secondary | ICD-10-CM | POA: Diagnosis not present

## 2021-01-24 DIAGNOSIS — I4891 Unspecified atrial fibrillation: Secondary | ICD-10-CM | POA: Diagnosis not present

## 2021-01-24 DIAGNOSIS — I340 Nonrheumatic mitral (valve) insufficiency: Secondary | ICD-10-CM | POA: Diagnosis not present

## 2021-01-24 DIAGNOSIS — I511 Rupture of chordae tendineae, not elsewhere classified: Secondary | ICD-10-CM | POA: Diagnosis not present

## 2021-01-25 DIAGNOSIS — I4891 Unspecified atrial fibrillation: Secondary | ICD-10-CM | POA: Diagnosis not present

## 2021-02-15 DIAGNOSIS — I4891 Unspecified atrial fibrillation: Secondary | ICD-10-CM | POA: Diagnosis not present

## 2021-02-15 DIAGNOSIS — E559 Vitamin D deficiency, unspecified: Secondary | ICD-10-CM | POA: Diagnosis not present

## 2021-02-15 DIAGNOSIS — Z79899 Other long term (current) drug therapy: Secondary | ICD-10-CM | POA: Diagnosis not present

## 2021-02-15 DIAGNOSIS — E118 Type 2 diabetes mellitus with unspecified complications: Secondary | ICD-10-CM | POA: Diagnosis not present

## 2021-02-15 DIAGNOSIS — Z125 Encounter for screening for malignant neoplasm of prostate: Secondary | ICD-10-CM | POA: Diagnosis not present

## 2021-02-15 DIAGNOSIS — E7800 Pure hypercholesterolemia, unspecified: Secondary | ICD-10-CM | POA: Diagnosis not present

## 2021-02-21 DIAGNOSIS — I340 Nonrheumatic mitral (valve) insufficiency: Secondary | ICD-10-CM | POA: Diagnosis not present

## 2021-02-21 DIAGNOSIS — I511 Rupture of chordae tendineae, not elsewhere classified: Secondary | ICD-10-CM | POA: Diagnosis not present

## 2021-02-21 DIAGNOSIS — I4891 Unspecified atrial fibrillation: Secondary | ICD-10-CM | POA: Diagnosis not present

## 2021-02-21 DIAGNOSIS — I517 Cardiomegaly: Secondary | ICD-10-CM | POA: Diagnosis not present

## 2021-02-21 DIAGNOSIS — I429 Cardiomyopathy, unspecified: Secondary | ICD-10-CM | POA: Diagnosis not present

## 2021-02-21 DIAGNOSIS — G4733 Obstructive sleep apnea (adult) (pediatric): Secondary | ICD-10-CM | POA: Diagnosis not present

## 2021-03-23 DIAGNOSIS — I511 Rupture of chordae tendineae, not elsewhere classified: Secondary | ICD-10-CM | POA: Diagnosis not present

## 2021-03-23 DIAGNOSIS — I340 Nonrheumatic mitral (valve) insufficiency: Secondary | ICD-10-CM | POA: Diagnosis not present

## 2021-03-23 DIAGNOSIS — I429 Cardiomyopathy, unspecified: Secondary | ICD-10-CM | POA: Diagnosis not present

## 2021-03-23 DIAGNOSIS — I4891 Unspecified atrial fibrillation: Secondary | ICD-10-CM | POA: Diagnosis not present

## 2021-03-23 DIAGNOSIS — E7800 Pure hypercholesterolemia, unspecified: Secondary | ICD-10-CM | POA: Diagnosis not present

## 2021-05-20 DIAGNOSIS — E7800 Pure hypercholesterolemia, unspecified: Secondary | ICD-10-CM | POA: Diagnosis not present

## 2021-05-20 DIAGNOSIS — Z79899 Other long term (current) drug therapy: Secondary | ICD-10-CM | POA: Diagnosis not present

## 2021-05-20 DIAGNOSIS — E559 Vitamin D deficiency, unspecified: Secondary | ICD-10-CM | POA: Diagnosis not present

## 2021-05-20 DIAGNOSIS — I10 Essential (primary) hypertension: Secondary | ICD-10-CM | POA: Diagnosis not present

## 2021-05-20 DIAGNOSIS — E118 Type 2 diabetes mellitus with unspecified complications: Secondary | ICD-10-CM | POA: Diagnosis not present

## 2021-05-20 DIAGNOSIS — E669 Obesity, unspecified: Secondary | ICD-10-CM | POA: Diagnosis not present

## 2021-05-23 DIAGNOSIS — I4891 Unspecified atrial fibrillation: Secondary | ICD-10-CM | POA: Diagnosis not present

## 2021-05-23 DIAGNOSIS — Z9889 Other specified postprocedural states: Secondary | ICD-10-CM | POA: Diagnosis not present

## 2021-05-23 DIAGNOSIS — I428 Other cardiomyopathies: Secondary | ICD-10-CM | POA: Diagnosis not present

## 2021-05-23 DIAGNOSIS — Z7901 Long term (current) use of anticoagulants: Secondary | ICD-10-CM | POA: Diagnosis not present

## 2021-05-24 DIAGNOSIS — I517 Cardiomegaly: Secondary | ICD-10-CM | POA: Diagnosis not present

## 2021-08-14 DIAGNOSIS — R051 Acute cough: Secondary | ICD-10-CM | POA: Diagnosis not present

## 2021-08-14 DIAGNOSIS — R0981 Nasal congestion: Secondary | ICD-10-CM | POA: Diagnosis not present

## 2021-08-14 DIAGNOSIS — J209 Acute bronchitis, unspecified: Secondary | ICD-10-CM | POA: Diagnosis not present

## 2021-08-23 DIAGNOSIS — I10 Essential (primary) hypertension: Secondary | ICD-10-CM | POA: Diagnosis not present

## 2021-08-23 DIAGNOSIS — Z79899 Other long term (current) drug therapy: Secondary | ICD-10-CM | POA: Diagnosis not present

## 2021-08-23 DIAGNOSIS — E118 Type 2 diabetes mellitus with unspecified complications: Secondary | ICD-10-CM | POA: Diagnosis not present

## 2021-08-23 DIAGNOSIS — E7800 Pure hypercholesterolemia, unspecified: Secondary | ICD-10-CM | POA: Diagnosis not present

## 2021-08-23 DIAGNOSIS — E559 Vitamin D deficiency, unspecified: Secondary | ICD-10-CM | POA: Diagnosis not present

## 2021-10-26 DIAGNOSIS — R0981 Nasal congestion: Secondary | ICD-10-CM | POA: Diagnosis not present

## 2021-10-26 DIAGNOSIS — J0100 Acute maxillary sinusitis, unspecified: Secondary | ICD-10-CM | POA: Diagnosis not present

## 2021-10-26 DIAGNOSIS — R051 Acute cough: Secondary | ICD-10-CM | POA: Diagnosis not present

## 2021-10-26 DIAGNOSIS — E119 Type 2 diabetes mellitus without complications: Secondary | ICD-10-CM | POA: Diagnosis not present

## 2021-10-29 DIAGNOSIS — E119 Type 2 diabetes mellitus without complications: Secondary | ICD-10-CM | POA: Diagnosis not present

## 2021-10-29 DIAGNOSIS — R072 Precordial pain: Secondary | ICD-10-CM | POA: Diagnosis not present

## 2021-10-29 DIAGNOSIS — Z794 Long term (current) use of insulin: Secondary | ICD-10-CM | POA: Diagnosis not present

## 2021-10-29 DIAGNOSIS — R0789 Other chest pain: Secondary | ICD-10-CM | POA: Diagnosis not present

## 2021-10-29 DIAGNOSIS — I10 Essential (primary) hypertension: Secondary | ICD-10-CM | POA: Diagnosis not present

## 2021-10-29 DIAGNOSIS — R079 Chest pain, unspecified: Secondary | ICD-10-CM | POA: Diagnosis not present

## 2021-10-29 DIAGNOSIS — R001 Bradycardia, unspecified: Secondary | ICD-10-CM | POA: Diagnosis not present

## 2021-10-29 DIAGNOSIS — I4891 Unspecified atrial fibrillation: Secondary | ICD-10-CM | POA: Diagnosis not present

## 2021-10-29 DIAGNOSIS — Z79899 Other long term (current) drug therapy: Secondary | ICD-10-CM | POA: Diagnosis not present

## 2021-11-03 DIAGNOSIS — Z7901 Long term (current) use of anticoagulants: Secondary | ICD-10-CM | POA: Diagnosis not present

## 2021-11-03 DIAGNOSIS — Z79899 Other long term (current) drug therapy: Secondary | ICD-10-CM | POA: Diagnosis not present

## 2021-11-03 DIAGNOSIS — I428 Other cardiomyopathies: Secondary | ICD-10-CM | POA: Diagnosis not present

## 2021-11-03 DIAGNOSIS — I4891 Unspecified atrial fibrillation: Secondary | ICD-10-CM | POA: Diagnosis not present

## 2021-11-03 DIAGNOSIS — I499 Cardiac arrhythmia, unspecified: Secondary | ICD-10-CM | POA: Diagnosis not present

## 2021-11-08 DIAGNOSIS — R079 Chest pain, unspecified: Secondary | ICD-10-CM | POA: Diagnosis not present

## 2021-11-28 DIAGNOSIS — I340 Nonrheumatic mitral (valve) insufficiency: Secondary | ICD-10-CM | POA: Diagnosis not present

## 2021-11-28 DIAGNOSIS — I10 Essential (primary) hypertension: Secondary | ICD-10-CM | POA: Diagnosis not present

## 2021-11-28 DIAGNOSIS — I4891 Unspecified atrial fibrillation: Secondary | ICD-10-CM | POA: Diagnosis not present

## 2021-11-28 DIAGNOSIS — I511 Rupture of chordae tendineae, not elsewhere classified: Secondary | ICD-10-CM | POA: Diagnosis not present

## 2021-11-29 DIAGNOSIS — R002 Palpitations: Secondary | ICD-10-CM | POA: Diagnosis not present

## 2021-11-29 DIAGNOSIS — I4891 Unspecified atrial fibrillation: Secondary | ICD-10-CM | POA: Diagnosis not present

## 2021-12-06 DIAGNOSIS — I10 Essential (primary) hypertension: Secondary | ICD-10-CM | POA: Diagnosis not present

## 2021-12-06 DIAGNOSIS — E118 Type 2 diabetes mellitus with unspecified complications: Secondary | ICD-10-CM | POA: Diagnosis not present

## 2021-12-06 DIAGNOSIS — Z79899 Other long term (current) drug therapy: Secondary | ICD-10-CM | POA: Diagnosis not present

## 2021-12-06 DIAGNOSIS — E1165 Type 2 diabetes mellitus with hyperglycemia: Secondary | ICD-10-CM | POA: Diagnosis not present

## 2021-12-06 DIAGNOSIS — I4891 Unspecified atrial fibrillation: Secondary | ICD-10-CM | POA: Diagnosis not present

## 2021-12-06 DIAGNOSIS — E7800 Pure hypercholesterolemia, unspecified: Secondary | ICD-10-CM | POA: Diagnosis not present

## 2021-12-06 DIAGNOSIS — Z1331 Encounter for screening for depression: Secondary | ICD-10-CM | POA: Diagnosis not present

## 2022-03-07 DIAGNOSIS — Z125 Encounter for screening for malignant neoplasm of prostate: Secondary | ICD-10-CM | POA: Diagnosis not present

## 2022-03-07 DIAGNOSIS — I4891 Unspecified atrial fibrillation: Secondary | ICD-10-CM | POA: Diagnosis not present

## 2022-03-07 DIAGNOSIS — E7800 Pure hypercholesterolemia, unspecified: Secondary | ICD-10-CM | POA: Diagnosis not present

## 2022-03-07 DIAGNOSIS — Z79899 Other long term (current) drug therapy: Secondary | ICD-10-CM | POA: Diagnosis not present

## 2022-03-07 DIAGNOSIS — R1110 Vomiting, unspecified: Secondary | ICD-10-CM | POA: Diagnosis not present

## 2022-03-07 DIAGNOSIS — E1165 Type 2 diabetes mellitus with hyperglycemia: Secondary | ICD-10-CM | POA: Diagnosis not present

## 2022-03-07 DIAGNOSIS — I10 Essential (primary) hypertension: Secondary | ICD-10-CM | POA: Diagnosis not present

## 2022-03-16 DIAGNOSIS — R1900 Intra-abdominal and pelvic swelling, mass and lump, unspecified site: Secondary | ICD-10-CM | POA: Diagnosis not present

## 2022-03-16 DIAGNOSIS — K3189 Other diseases of stomach and duodenum: Secondary | ICD-10-CM | POA: Diagnosis not present

## 2022-03-16 DIAGNOSIS — I700 Atherosclerosis of aorta: Secondary | ICD-10-CM | POA: Diagnosis not present

## 2022-03-16 DIAGNOSIS — K6389 Other specified diseases of intestine: Secondary | ICD-10-CM | POA: Diagnosis not present

## 2022-03-16 DIAGNOSIS — K5730 Diverticulosis of large intestine without perforation or abscess without bleeding: Secondary | ICD-10-CM | POA: Diagnosis not present

## 2022-05-08 DIAGNOSIS — K5900 Constipation, unspecified: Secondary | ICD-10-CM | POA: Diagnosis not present

## 2022-05-08 DIAGNOSIS — R1013 Epigastric pain: Secondary | ICD-10-CM | POA: Diagnosis not present

## 2022-05-08 DIAGNOSIS — K219 Gastro-esophageal reflux disease without esophagitis: Secondary | ICD-10-CM | POA: Diagnosis not present

## 2022-05-08 DIAGNOSIS — R112 Nausea with vomiting, unspecified: Secondary | ICD-10-CM | POA: Diagnosis not present

## 2022-06-05 DIAGNOSIS — I511 Rupture of chordae tendineae, not elsewhere classified: Secondary | ICD-10-CM | POA: Diagnosis not present

## 2022-06-05 DIAGNOSIS — E119 Type 2 diabetes mellitus without complications: Secondary | ICD-10-CM | POA: Diagnosis not present

## 2022-06-05 DIAGNOSIS — I10 Essential (primary) hypertension: Secondary | ICD-10-CM | POA: Diagnosis not present

## 2022-06-05 DIAGNOSIS — G4733 Obstructive sleep apnea (adult) (pediatric): Secondary | ICD-10-CM | POA: Diagnosis not present

## 2022-06-05 DIAGNOSIS — Z7901 Long term (current) use of anticoagulants: Secondary | ICD-10-CM | POA: Diagnosis not present

## 2022-06-05 DIAGNOSIS — Z9289 Personal history of other medical treatment: Secondary | ICD-10-CM | POA: Diagnosis not present

## 2022-06-05 DIAGNOSIS — R002 Palpitations: Secondary | ICD-10-CM | POA: Diagnosis not present

## 2022-06-05 DIAGNOSIS — I4891 Unspecified atrial fibrillation: Secondary | ICD-10-CM | POA: Diagnosis not present

## 2022-06-05 DIAGNOSIS — I428 Other cardiomyopathies: Secondary | ICD-10-CM | POA: Diagnosis not present

## 2022-06-05 DIAGNOSIS — Z79899 Other long term (current) drug therapy: Secondary | ICD-10-CM | POA: Diagnosis not present

## 2022-06-05 DIAGNOSIS — I340 Nonrheumatic mitral (valve) insufficiency: Secondary | ICD-10-CM | POA: Diagnosis not present

## 2022-06-13 DIAGNOSIS — K648 Other hemorrhoids: Secondary | ICD-10-CM | POA: Diagnosis not present

## 2022-06-13 DIAGNOSIS — Z1211 Encounter for screening for malignant neoplasm of colon: Secondary | ICD-10-CM | POA: Diagnosis not present

## 2022-06-13 DIAGNOSIS — K635 Polyp of colon: Secondary | ICD-10-CM | POA: Diagnosis not present

## 2022-06-13 DIAGNOSIS — E119 Type 2 diabetes mellitus without complications: Secondary | ICD-10-CM | POA: Diagnosis not present

## 2022-06-13 DIAGNOSIS — K449 Diaphragmatic hernia without obstruction or gangrene: Secondary | ICD-10-CM | POA: Diagnosis not present

## 2022-06-13 DIAGNOSIS — R112 Nausea with vomiting, unspecified: Secondary | ICD-10-CM | POA: Diagnosis not present

## 2022-06-13 DIAGNOSIS — K2900 Acute gastritis without bleeding: Secondary | ICD-10-CM | POA: Diagnosis not present

## 2022-06-13 DIAGNOSIS — R109 Unspecified abdominal pain: Secondary | ICD-10-CM | POA: Diagnosis not present

## 2022-06-13 DIAGNOSIS — K2970 Gastritis, unspecified, without bleeding: Secondary | ICD-10-CM | POA: Diagnosis not present

## 2022-06-13 DIAGNOSIS — R110 Nausea: Secondary | ICD-10-CM | POA: Diagnosis not present

## 2022-06-13 DIAGNOSIS — D126 Benign neoplasm of colon, unspecified: Secondary | ICD-10-CM | POA: Diagnosis not present

## 2022-06-13 DIAGNOSIS — D122 Benign neoplasm of ascending colon: Secondary | ICD-10-CM | POA: Diagnosis not present

## 2022-06-13 DIAGNOSIS — K219 Gastro-esophageal reflux disease without esophagitis: Secondary | ICD-10-CM | POA: Diagnosis not present

## 2022-06-14 DIAGNOSIS — R935 Abnormal findings on diagnostic imaging of other abdominal regions, including retroperitoneum: Secondary | ICD-10-CM | POA: Diagnosis not present

## 2022-06-14 DIAGNOSIS — R7401 Elevation of levels of liver transaminase levels: Secondary | ICD-10-CM | POA: Diagnosis not present

## 2022-06-14 DIAGNOSIS — Z1211 Encounter for screening for malignant neoplasm of colon: Secondary | ICD-10-CM | POA: Diagnosis not present

## 2022-06-14 DIAGNOSIS — R109 Unspecified abdominal pain: Secondary | ICD-10-CM | POA: Diagnosis not present

## 2022-06-14 DIAGNOSIS — R112 Nausea with vomiting, unspecified: Secondary | ICD-10-CM | POA: Diagnosis not present

## 2022-07-03 DIAGNOSIS — R1011 Right upper quadrant pain: Secondary | ICD-10-CM | POA: Diagnosis not present

## 2022-07-11 DIAGNOSIS — E119 Type 2 diabetes mellitus without complications: Secondary | ICD-10-CM | POA: Diagnosis not present

## 2022-07-11 DIAGNOSIS — R1011 Right upper quadrant pain: Secondary | ICD-10-CM | POA: Diagnosis not present

## 2022-07-11 DIAGNOSIS — R112 Nausea with vomiting, unspecified: Secondary | ICD-10-CM | POA: Diagnosis not present

## 2022-07-11 DIAGNOSIS — I10 Essential (primary) hypertension: Secondary | ICD-10-CM | POA: Diagnosis not present

## 2022-07-24 DIAGNOSIS — R112 Nausea with vomiting, unspecified: Secondary | ICD-10-CM | POA: Diagnosis not present

## 2022-07-24 DIAGNOSIS — Z7984 Long term (current) use of oral hypoglycemic drugs: Secondary | ICD-10-CM | POA: Diagnosis not present

## 2022-07-24 DIAGNOSIS — K8010 Calculus of gallbladder with chronic cholecystitis without obstruction: Secondary | ICD-10-CM | POA: Diagnosis not present

## 2022-07-24 DIAGNOSIS — I4891 Unspecified atrial fibrillation: Secondary | ICD-10-CM | POA: Diagnosis not present

## 2022-07-24 DIAGNOSIS — E119 Type 2 diabetes mellitus without complications: Secondary | ICD-10-CM | POA: Diagnosis not present

## 2022-07-24 DIAGNOSIS — I10 Essential (primary) hypertension: Secondary | ICD-10-CM | POA: Diagnosis not present

## 2022-07-24 DIAGNOSIS — Z87891 Personal history of nicotine dependence: Secondary | ICD-10-CM | POA: Diagnosis not present

## 2022-07-24 DIAGNOSIS — K811 Chronic cholecystitis: Secondary | ICD-10-CM | POA: Diagnosis not present

## 2022-07-24 DIAGNOSIS — Z7901 Long term (current) use of anticoagulants: Secondary | ICD-10-CM | POA: Diagnosis not present

## 2022-07-24 DIAGNOSIS — R1011 Right upper quadrant pain: Secondary | ICD-10-CM | POA: Diagnosis not present

## 2022-07-24 DIAGNOSIS — K838 Other specified diseases of biliary tract: Secondary | ICD-10-CM | POA: Diagnosis not present

## 2022-08-02 DIAGNOSIS — R809 Proteinuria, unspecified: Secondary | ICD-10-CM | POA: Diagnosis not present

## 2022-08-02 DIAGNOSIS — E1129 Type 2 diabetes mellitus with other diabetic kidney complication: Secondary | ICD-10-CM | POA: Diagnosis not present

## 2022-08-02 DIAGNOSIS — Z79899 Other long term (current) drug therapy: Secondary | ICD-10-CM | POA: Diagnosis not present

## 2022-08-02 DIAGNOSIS — R7989 Other specified abnormal findings of blood chemistry: Secondary | ICD-10-CM | POA: Diagnosis not present

## 2022-08-02 DIAGNOSIS — E7800 Pure hypercholesterolemia, unspecified: Secondary | ICD-10-CM | POA: Diagnosis not present

## 2022-08-02 DIAGNOSIS — I10 Essential (primary) hypertension: Secondary | ICD-10-CM | POA: Diagnosis not present

## 2022-08-10 DIAGNOSIS — I4891 Unspecified atrial fibrillation: Secondary | ICD-10-CM | POA: Diagnosis not present

## 2022-08-16 DIAGNOSIS — I4891 Unspecified atrial fibrillation: Secondary | ICD-10-CM | POA: Diagnosis not present

## 2022-08-18 DIAGNOSIS — I10 Essential (primary) hypertension: Secondary | ICD-10-CM | POA: Diagnosis not present

## 2022-08-18 DIAGNOSIS — E119 Type 2 diabetes mellitus without complications: Secondary | ICD-10-CM | POA: Diagnosis not present

## 2022-08-18 DIAGNOSIS — I4891 Unspecified atrial fibrillation: Secondary | ICD-10-CM | POA: Diagnosis not present

## 2022-08-18 DIAGNOSIS — Z79899 Other long term (current) drug therapy: Secondary | ICD-10-CM | POA: Diagnosis not present

## 2022-08-18 DIAGNOSIS — I428 Other cardiomyopathies: Secondary | ICD-10-CM | POA: Diagnosis not present

## 2022-08-18 DIAGNOSIS — I4892 Unspecified atrial flutter: Secondary | ICD-10-CM | POA: Diagnosis not present

## 2022-08-18 DIAGNOSIS — Z87891 Personal history of nicotine dependence: Secondary | ICD-10-CM | POA: Diagnosis not present

## 2022-08-18 DIAGNOSIS — K219 Gastro-esophageal reflux disease without esophagitis: Secondary | ICD-10-CM | POA: Diagnosis not present

## 2022-08-18 DIAGNOSIS — G4733 Obstructive sleep apnea (adult) (pediatric): Secondary | ICD-10-CM | POA: Diagnosis not present

## 2022-08-18 DIAGNOSIS — Z7984 Long term (current) use of oral hypoglycemic drugs: Secondary | ICD-10-CM | POA: Diagnosis not present

## 2022-08-18 DIAGNOSIS — I4819 Other persistent atrial fibrillation: Secondary | ICD-10-CM | POA: Diagnosis not present

## 2022-08-19 DIAGNOSIS — Z87891 Personal history of nicotine dependence: Secondary | ICD-10-CM | POA: Diagnosis not present

## 2022-08-19 DIAGNOSIS — Z7984 Long term (current) use of oral hypoglycemic drugs: Secondary | ICD-10-CM | POA: Diagnosis not present

## 2022-08-19 DIAGNOSIS — Z79899 Other long term (current) drug therapy: Secondary | ICD-10-CM | POA: Diagnosis not present

## 2022-08-19 DIAGNOSIS — K219 Gastro-esophageal reflux disease without esophagitis: Secondary | ICD-10-CM | POA: Diagnosis not present

## 2022-08-19 DIAGNOSIS — I4891 Unspecified atrial fibrillation: Secondary | ICD-10-CM | POA: Diagnosis not present

## 2022-08-19 DIAGNOSIS — I4819 Other persistent atrial fibrillation: Secondary | ICD-10-CM | POA: Diagnosis not present

## 2022-08-19 DIAGNOSIS — I428 Other cardiomyopathies: Secondary | ICD-10-CM | POA: Diagnosis not present

## 2022-08-19 DIAGNOSIS — G4733 Obstructive sleep apnea (adult) (pediatric): Secondary | ICD-10-CM | POA: Diagnosis not present

## 2022-08-19 DIAGNOSIS — E119 Type 2 diabetes mellitus without complications: Secondary | ICD-10-CM | POA: Diagnosis not present

## 2022-08-19 DIAGNOSIS — I10 Essential (primary) hypertension: Secondary | ICD-10-CM | POA: Diagnosis not present

## 2022-08-19 DIAGNOSIS — I4892 Unspecified atrial flutter: Secondary | ICD-10-CM | POA: Diagnosis not present

## 2022-09-13 DIAGNOSIS — R7989 Other specified abnormal findings of blood chemistry: Secondary | ICD-10-CM | POA: Diagnosis not present

## 2022-09-14 DIAGNOSIS — J0100 Acute maxillary sinusitis, unspecified: Secondary | ICD-10-CM | POA: Diagnosis not present

## 2022-10-18 DIAGNOSIS — Z9889 Other specified postprocedural states: Secondary | ICD-10-CM | POA: Diagnosis not present

## 2022-10-18 DIAGNOSIS — L570 Actinic keratosis: Secondary | ICD-10-CM | POA: Diagnosis not present

## 2022-10-18 DIAGNOSIS — D485 Neoplasm of uncertain behavior of skin: Secondary | ICD-10-CM | POA: Diagnosis not present

## 2022-10-18 DIAGNOSIS — I4819 Other persistent atrial fibrillation: Secondary | ICD-10-CM | POA: Diagnosis not present

## 2022-10-18 DIAGNOSIS — I428 Other cardiomyopathies: Secondary | ICD-10-CM | POA: Diagnosis not present

## 2022-10-18 DIAGNOSIS — R001 Bradycardia, unspecified: Secondary | ICD-10-CM | POA: Diagnosis not present

## 2022-10-18 DIAGNOSIS — I10 Essential (primary) hypertension: Secondary | ICD-10-CM | POA: Diagnosis not present

## 2022-12-11 DIAGNOSIS — Z79899 Other long term (current) drug therapy: Secondary | ICD-10-CM | POA: Diagnosis not present

## 2022-12-11 DIAGNOSIS — R809 Proteinuria, unspecified: Secondary | ICD-10-CM | POA: Diagnosis not present

## 2022-12-11 DIAGNOSIS — E7800 Pure hypercholesterolemia, unspecified: Secondary | ICD-10-CM | POA: Diagnosis not present

## 2022-12-11 DIAGNOSIS — I10 Essential (primary) hypertension: Secondary | ICD-10-CM | POA: Diagnosis not present

## 2022-12-11 DIAGNOSIS — E1129 Type 2 diabetes mellitus with other diabetic kidney complication: Secondary | ICD-10-CM | POA: Diagnosis not present

## 2022-12-25 DIAGNOSIS — I4819 Other persistent atrial fibrillation: Secondary | ICD-10-CM | POA: Diagnosis not present

## 2023-01-10 DIAGNOSIS — Z8679 Personal history of other diseases of the circulatory system: Secondary | ICD-10-CM | POA: Diagnosis not present

## 2023-01-10 DIAGNOSIS — I4819 Other persistent atrial fibrillation: Secondary | ICD-10-CM | POA: Diagnosis not present

## 2023-02-20 DIAGNOSIS — I4819 Other persistent atrial fibrillation: Secondary | ICD-10-CM | POA: Diagnosis not present

## 2023-02-20 DIAGNOSIS — I480 Paroxysmal atrial fibrillation: Secondary | ICD-10-CM | POA: Diagnosis not present

## 2023-03-15 DIAGNOSIS — E1129 Type 2 diabetes mellitus with other diabetic kidney complication: Secondary | ICD-10-CM | POA: Diagnosis not present

## 2023-03-15 DIAGNOSIS — I10 Essential (primary) hypertension: Secondary | ICD-10-CM | POA: Diagnosis not present

## 2023-03-15 DIAGNOSIS — R809 Proteinuria, unspecified: Secondary | ICD-10-CM | POA: Diagnosis not present

## 2023-03-15 DIAGNOSIS — E7800 Pure hypercholesterolemia, unspecified: Secondary | ICD-10-CM | POA: Diagnosis not present

## 2023-04-18 DIAGNOSIS — L570 Actinic keratosis: Secondary | ICD-10-CM | POA: Diagnosis not present

## 2023-07-10 DIAGNOSIS — I4819 Other persistent atrial fibrillation: Secondary | ICD-10-CM | POA: Diagnosis not present

## 2023-07-11 DIAGNOSIS — I4719 Other supraventricular tachycardia: Secondary | ICD-10-CM | POA: Diagnosis not present

## 2023-07-11 DIAGNOSIS — I4729 Other ventricular tachycardia: Secondary | ICD-10-CM | POA: Diagnosis not present

## 2023-07-11 DIAGNOSIS — I4819 Other persistent atrial fibrillation: Secondary | ICD-10-CM | POA: Diagnosis not present

## 2023-07-18 DIAGNOSIS — I10 Essential (primary) hypertension: Secondary | ICD-10-CM | POA: Diagnosis not present

## 2023-07-18 DIAGNOSIS — E1129 Type 2 diabetes mellitus with other diabetic kidney complication: Secondary | ICD-10-CM | POA: Diagnosis not present

## 2023-07-18 DIAGNOSIS — Z125 Encounter for screening for malignant neoplasm of prostate: Secondary | ICD-10-CM | POA: Diagnosis not present

## 2023-07-18 DIAGNOSIS — Z79899 Other long term (current) drug therapy: Secondary | ICD-10-CM | POA: Diagnosis not present

## 2023-07-18 DIAGNOSIS — E7800 Pure hypercholesterolemia, unspecified: Secondary | ICD-10-CM | POA: Diagnosis not present

## 2023-07-18 DIAGNOSIS — E559 Vitamin D deficiency, unspecified: Secondary | ICD-10-CM | POA: Diagnosis not present

## 2023-07-18 DIAGNOSIS — R809 Proteinuria, unspecified: Secondary | ICD-10-CM | POA: Diagnosis not present

## 2023-08-01 DIAGNOSIS — H524 Presbyopia: Secondary | ICD-10-CM | POA: Diagnosis not present

## 2023-08-01 DIAGNOSIS — E119 Type 2 diabetes mellitus without complications: Secondary | ICD-10-CM | POA: Diagnosis not present

## 2023-08-20 DIAGNOSIS — I4819 Other persistent atrial fibrillation: Secondary | ICD-10-CM | POA: Diagnosis not present

## 2023-10-16 DIAGNOSIS — L219 Seborrheic dermatitis, unspecified: Secondary | ICD-10-CM | POA: Diagnosis not present

## 2023-10-16 DIAGNOSIS — L570 Actinic keratosis: Secondary | ICD-10-CM | POA: Diagnosis not present

## 2024-01-12 DIAGNOSIS — R079 Chest pain, unspecified: Secondary | ICD-10-CM | POA: Diagnosis not present

## 2024-11-19 ENCOUNTER — Other Ambulatory Visit (HOSPITAL_BASED_OUTPATIENT_CLINIC_OR_DEPARTMENT_OTHER): Payer: Self-pay | Admitting: Family Medicine

## 2024-11-19 DIAGNOSIS — N289 Disorder of kidney and ureter, unspecified: Secondary | ICD-10-CM

## 2024-11-28 ENCOUNTER — Ambulatory Visit (HOSPITAL_BASED_OUTPATIENT_CLINIC_OR_DEPARTMENT_OTHER)
Admission: RE | Admit: 2024-11-28 | Discharge: 2024-11-28 | Disposition: A | Source: Ambulatory Visit | Attending: Family Medicine | Admitting: Family Medicine

## 2024-11-28 DIAGNOSIS — N289 Disorder of kidney and ureter, unspecified: Secondary | ICD-10-CM
